# Patient Record
Sex: Male | Born: 1970 | Hispanic: Yes | Marital: Single | Smoking: Current every day smoker
Health system: Southern US, Community
[De-identification: ages and names within clinical notes are randomized; demographics above are authoritative.]

## PROBLEM LIST (undated history)

## (undated) DIAGNOSIS — I10 Essential (primary) hypertension: Secondary | ICD-10-CM

---

## 2018-11-13 ENCOUNTER — Emergency Department (HOSPITAL_COMMUNITY): Payer: No Typology Code available for payment source

## 2018-11-13 ENCOUNTER — Encounter (HOSPITAL_COMMUNITY): Payer: Self-pay | Admitting: Emergency Medicine

## 2018-11-13 ENCOUNTER — Other Ambulatory Visit: Payer: Self-pay

## 2018-11-13 ENCOUNTER — Emergency Department (HOSPITAL_COMMUNITY)
Admission: EM | Admit: 2018-11-13 | Discharge: 2018-11-13 | Disposition: A | Discharge status: Home / Self Care | Payer: No Typology Code available for payment source | Attending: Emergency Medicine | Admitting: Emergency Medicine

## 2018-11-13 DIAGNOSIS — Y999 Unspecified external cause status: Secondary | ICD-10-CM | POA: Insufficient documentation

## 2018-11-13 DIAGNOSIS — M5489 Other dorsalgia: Secondary | ICD-10-CM | POA: Insufficient documentation

## 2018-11-13 DIAGNOSIS — Y939 Activity, unspecified: Secondary | ICD-10-CM | POA: Insufficient documentation

## 2018-11-13 DIAGNOSIS — I10 Essential (primary) hypertension: Secondary | ICD-10-CM | POA: Insufficient documentation

## 2018-11-13 DIAGNOSIS — M7918 Myalgia, other site: Secondary | ICD-10-CM | POA: Insufficient documentation

## 2018-11-13 DIAGNOSIS — F17210 Nicotine dependence, cigarettes, uncomplicated: Secondary | ICD-10-CM | POA: Insufficient documentation

## 2018-11-13 DIAGNOSIS — M62838 Other muscle spasm: Secondary | ICD-10-CM | POA: Diagnosis not present

## 2018-11-13 DIAGNOSIS — Y929 Unspecified place or not applicable: Secondary | ICD-10-CM | POA: Diagnosis not present

## 2018-11-13 DIAGNOSIS — M542 Cervicalgia: Secondary | ICD-10-CM | POA: Diagnosis present

## 2018-11-13 HISTORY — DX: Essential (primary) hypertension: I10

## 2018-11-13 MED ORDER — CYCLOBENZAPRINE HCL 10 MG PO TABS: 10.0000 mg | ORAL_TABLET | Freq: Two times a day (BID) | ORAL | 0 refills | Status: DC | PRN
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
## 2018-11-29 ENCOUNTER — Other Ambulatory Visit: Payer: Self-pay

## 2018-11-29 ENCOUNTER — Emergency Department (HOSPITAL_COMMUNITY)
Admission: EM | Admit: 2018-11-29 | Discharge: 2018-11-29 | Disposition: A | Discharge status: Home / Self Care | Payer: Self-pay | Attending: Emergency Medicine | Admitting: Emergency Medicine

## 2018-11-29 DIAGNOSIS — Z79899 Other long term (current) drug therapy: Secondary | ICD-10-CM | POA: Diagnosis not present

## 2018-11-29 DIAGNOSIS — Y92410 Unspecified street and highway as the place of occurrence of the external cause: Secondary | ICD-10-CM | POA: Diagnosis not present

## 2018-11-29 DIAGNOSIS — F17210 Nicotine dependence, cigarettes, uncomplicated: Secondary | ICD-10-CM | POA: Diagnosis not present

## 2018-11-29 DIAGNOSIS — Y939 Activity, unspecified: Secondary | ICD-10-CM | POA: Insufficient documentation

## 2018-11-29 DIAGNOSIS — Y999 Unspecified external cause status: Secondary | ICD-10-CM | POA: Insufficient documentation

## 2018-11-29 DIAGNOSIS — S46912A Strain of unspecified muscle, fascia and tendon at shoulder and upper arm level, left arm, initial encounter: Secondary | ICD-10-CM | POA: Insufficient documentation

## 2018-11-29 DIAGNOSIS — I10 Essential (primary) hypertension: Secondary | ICD-10-CM | POA: Insufficient documentation

## 2018-11-29 DIAGNOSIS — S4992XA Unspecified injury of left shoulder and upper arm, initial encounter: Secondary | ICD-10-CM | POA: Diagnosis present

## 2018-11-29 MED ORDER — CYCLOBENZAPRINE HCL 10 MG PO TABS: 10.0000 mg | ORAL_TABLET | Freq: Three times a day (TID) | ORAL | 0 refills | Status: AC | PRN

## 2018-11-29 MED ORDER — NAPROXEN 500 MG PO TABS: 500.0000 mg | ORAL_TABLET | Freq: Two times a day (BID) | ORAL | 0 refills | Status: AC

## 2018-11-29 NOTE — ED Notes (Signed)
Bed: WA04 Expected date:  Expected time:  Means of arrival:  Comments: 
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
## 2018-11-29 NOTE — Discharge Instructions (Signed)
Wear arm sling for comfort.  Naproxen as prescribed.  Flexeril as prescribed as needed for pain not relieved with naproxen.  Follow-up with orthopedics if not improving in the next week.  Their contact information has been provided in this discharge summary for you to call and make these arrangements.
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
